# Patient Record
Sex: Female | Born: 1950 | Race: Black or African American | Hispanic: No | Smoking: Never smoker
Health system: Southern US, Community
[De-identification: ages and names within clinical notes are randomized; demographics above are authoritative.]

## PROBLEM LIST (undated history)

## (undated) DIAGNOSIS — C801 Malignant (primary) neoplasm, unspecified: Secondary | ICD-10-CM

## (undated) DIAGNOSIS — E119 Type 2 diabetes mellitus without complications: Secondary | ICD-10-CM

## (undated) DIAGNOSIS — I10 Essential (primary) hypertension: Secondary | ICD-10-CM

## (undated) HISTORY — PX: HIP SURGERY: SHX245

## (undated) HISTORY — PX: BREAST SURGERY: SHX581

## (undated) HISTORY — PX: BRAIN SURGERY: SHX531

---

## 2002-05-09 DIAGNOSIS — C801 Malignant (primary) neoplasm, unspecified: Secondary | ICD-10-CM

## 2002-05-09 HISTORY — DX: Malignant (primary) neoplasm, unspecified: C80.1

## 2017-03-29 ENCOUNTER — Observation Stay (HOSPITAL_COMMUNITY)
Admission: EM | Admit: 2017-03-29 | Discharge: 2017-03-31 | Disposition: A | Discharge status: Home / Self Care | Payer: Medicare Other | Attending: Internal Medicine | Admitting: Internal Medicine

## 2017-03-29 ENCOUNTER — Emergency Department (HOSPITAL_COMMUNITY): Payer: Medicare Other

## 2017-03-29 ENCOUNTER — Encounter (HOSPITAL_COMMUNITY): Payer: Self-pay

## 2017-03-29 DIAGNOSIS — E118 Type 2 diabetes mellitus with unspecified complications: Secondary | ICD-10-CM

## 2017-03-29 DIAGNOSIS — Z79899 Other long term (current) drug therapy: Secondary | ICD-10-CM | POA: Insufficient documentation

## 2017-03-29 DIAGNOSIS — C50919 Malignant neoplasm of unspecified site of unspecified female breast: Secondary | ICD-10-CM

## 2017-03-29 DIAGNOSIS — R4020 Unspecified coma: Secondary | ICD-10-CM | POA: Insufficient documentation

## 2017-03-29 DIAGNOSIS — I10 Essential (primary) hypertension: Secondary | ICD-10-CM | POA: Diagnosis not present

## 2017-03-29 DIAGNOSIS — N390 Urinary tract infection, site not specified: Secondary | ICD-10-CM | POA: Diagnosis not present

## 2017-03-29 DIAGNOSIS — Z853 Personal history of malignant neoplasm of breast: Secondary | ICD-10-CM | POA: Diagnosis not present

## 2017-03-29 DIAGNOSIS — R55 Syncope and collapse: Principal | ICD-10-CM | POA: Insufficient documentation

## 2017-03-29 DIAGNOSIS — Z7984 Long term (current) use of oral hypoglycemic drugs: Secondary | ICD-10-CM | POA: Insufficient documentation

## 2017-03-29 DIAGNOSIS — D649 Anemia, unspecified: Secondary | ICD-10-CM | POA: Diagnosis present

## 2017-03-29 DIAGNOSIS — Z7982 Long term (current) use of aspirin: Secondary | ICD-10-CM | POA: Diagnosis not present

## 2017-03-29 DIAGNOSIS — I160 Hypertensive urgency: Secondary | ICD-10-CM | POA: Diagnosis present

## 2017-03-29 DIAGNOSIS — E119 Type 2 diabetes mellitus without complications: Secondary | ICD-10-CM | POA: Diagnosis not present

## 2017-03-29 DIAGNOSIS — Z85841 Personal history of malignant neoplasm of brain: Secondary | ICD-10-CM | POA: Insufficient documentation

## 2017-03-29 DIAGNOSIS — R2681 Unsteadiness on feet: Secondary | ICD-10-CM | POA: Diagnosis not present

## 2017-03-29 DIAGNOSIS — N289 Disorder of kidney and ureter, unspecified: Secondary | ICD-10-CM

## 2017-03-29 DIAGNOSIS — Z7902 Long term (current) use of antithrombotics/antiplatelets: Secondary | ICD-10-CM | POA: Insufficient documentation

## 2017-03-29 DIAGNOSIS — Z87898 Personal history of other specified conditions: Secondary | ICD-10-CM | POA: Diagnosis not present

## 2017-03-29 HISTORY — DX: Malignant (primary) neoplasm, unspecified: C80.1

## 2017-03-29 HISTORY — DX: Essential (primary) hypertension: I10

## 2017-03-29 HISTORY — DX: Type 2 diabetes mellitus without complications: E11.9

## 2017-03-29 LAB — COMPREHENSIVE METABOLIC PANEL
ALBUMIN: 3.8 g/dL (ref 3.5–5.0)
ALK PHOS: 61 U/L (ref 38–126)
ALT: 11 U/L — AB (ref 14–54)
ANION GAP: 9 (ref 5–15)
AST: 15 U/L (ref 15–41)
BUN: 16 mg/dL (ref 6–20)
CALCIUM: 9.4 mg/dL (ref 8.9–10.3)
CO2: 25 mmol/L (ref 22–32)
Chloride: 103 mmol/L (ref 101–111)
Creatinine, Ser: 1.18 mg/dL — ABNORMAL HIGH (ref 0.44–1.00)
GFR calc Af Amer: 55 mL/min — ABNORMAL LOW (ref >60)
GFR calc non Af Amer: 48 mL/min — ABNORMAL LOW (ref >60)
GLUCOSE: 116 mg/dL — AB (ref 65–99)
Potassium: 3.3 mmol/L — ABNORMAL LOW (ref 3.5–5.1)
Sodium: 137 mmol/L (ref 135–145)
Total Bilirubin: 0.2 mg/dL — ABNORMAL LOW (ref 0.3–1.2)
Total Protein: 7.4 g/dL (ref 6.5–8.1)

## 2017-03-29 LAB — URINALYSIS, ROUTINE W REFLEX MICROSCOPIC
BILIRUBIN URINE: NEGATIVE
GLUCOSE, UA: NEGATIVE mg/dL
KETONES UR: NEGATIVE mg/dL
NITRITE: NEGATIVE
PH: 7 (ref 5.0–8.0)
Protein, ur: NEGATIVE mg/dL
SPECIFIC GRAVITY, URINE: 1.006 (ref 1.005–1.030)
Squamous Epithelial / LPF: NONE SEEN

## 2017-03-29 LAB — CBC WITH DIFFERENTIAL/PLATELET
BASOS ABS: 0 10*3/uL (ref 0.0–0.1)
Basophils Relative: 0 %
EOS PCT: 1 %
Eosinophils Absolute: 0.1 10*3/uL (ref 0.0–0.7)
HCT: 37 % (ref 36.0–46.0)
Hemoglobin: 11.9 g/dL — ABNORMAL LOW (ref 12.0–15.0)
Lymphocytes Relative: 38 %
Lymphs Abs: 2.7 10*3/uL (ref 0.7–4.0)
MCH: 28.4 pg (ref 26.0–34.0)
MCHC: 32.2 g/dL (ref 30.0–36.0)
MCV: 88.3 fL (ref 78.0–100.0)
MONO ABS: 0.3 10*3/uL (ref 0.1–1.0)
MONOS PCT: 4 %
NEUTROS PCT: 57 %
Neutro Abs: 3.9 10*3/uL (ref 1.7–7.7)
PLATELETS: 169 10*3/uL (ref 150–400)
RBC: 4.19 MIL/uL (ref 3.87–5.11)
RDW: 14.5 % (ref 11.5–15.5)
WBC: 7 10*3/uL (ref 4.0–10.5)

## 2017-03-29 LAB — I-STAT CHEM 8, ED
BUN: 19 mg/dL (ref 6–20)
CHLORIDE: 104 mmol/L (ref 101–111)
Calcium, Ion: 1.19 mmol/L (ref 1.15–1.40)
Creatinine, Ser: 1.2 mg/dL — ABNORMAL HIGH (ref 0.44–1.00)
GLUCOSE: 118 mg/dL — AB (ref 65–99)
HEMATOCRIT: 34 % — AB (ref 36.0–46.0)
HEMOGLOBIN: 11.6 g/dL — AB (ref 12.0–15.0)
POTASSIUM: 3.7 mmol/L (ref 3.5–5.1)
Sodium: 140 mmol/L (ref 135–145)
TCO2: 29 mmol/L (ref 22–32)

## 2017-03-29 LAB — CBG MONITORING, ED: GLUCOSE-CAPILLARY: 110 mg/dL — AB (ref 65–99)

## 2017-03-29 MED ORDER — LABETALOL HCL 5 MG/ML IV SOLN
10.0000 mg | Freq: Once | INTRAVENOUS | Status: AC
  Administered 2017-03-29: 10 mg via INTRAVENOUS
  Filled 2017-03-29: qty 4

## 2017-03-29 MED ORDER — SODIUM CHLORIDE 0.9 % IV BOLUS (SEPSIS)
1000.0000 mL | Freq: Once | INTRAVENOUS | Status: AC
  Administered 2017-03-29: 1000 mL via INTRAVENOUS

## 2017-03-29 MED ORDER — LISINOPRIL 10 MG PO TABS
10.0000 mg | ORAL_TABLET | Freq: Every day | ORAL | Status: DC
  Administered 2017-03-30 – 2017-03-31 (×2): 10 mg via ORAL
  Filled 2017-03-29 (×2): qty 1

## 2017-03-29 NOTE — ED Triage Notes (Addendum)
Pt from out of town ([place], [place]) presents via EMS after syncopal episode that occurred at approx 1925. Per EMS, pt was standing at her stove cooking when she lost consciousness. Unsure if pt sustained head injury. Per family, pt was unresponsive for approx 5 mins. Pt also reported nausea and generalized weakness. Pt alert to self but disoriented to time, place, and situation. EMS VS: 170/76, 76 HR, RR 16, 99% on RA, CBG 120.

## 2017-03-29 NOTE — ED Notes (Signed)
Per pt's daughter, pt was cooking over the stove when she began to lean to the left side and complaining of dizziness. Pt family reports she noticed her tongue fall to the left side and pt "turned white". Pt was assisted into chair by family. Pt daughter denies pt falling on the floor or any head injury. Pt's daughter states pt never lost consciousness but she "wasn't responding to my questions." No facial palsy noted. Grips present and strong bilaterally. PERRL.

## 2017-03-29 NOTE — ED Notes (Signed)
Patient transported to MRI 

## 2017-03-29 NOTE — ED Notes (Signed)
EDP updated to pt BP. Pharmacy at bedside updating medications.

## 2017-03-29 NOTE — ED Notes (Addendum)
Pt transported to CT and XR. EDP aware of pt BP

## 2017-03-29 NOTE — ED Notes (Signed)
Dr. Tyrone Nine at bedside, daughter just arrived.

## 2017-03-29 NOTE — ED Notes (Signed)
EDP at the bedside.  ?

## 2017-03-29 NOTE — ED Provider Notes (Signed)
[facility] EMERGENCY DEPARTMENT Provider Note   CSN: [id number] Arrival date & time: 03/29/17  2039     History   Chief Complaint Chief Complaint  Patient presents with  . Loss of Consciousness    HPI Kelly Leon is a 66 y.o. female.  66 yo F with a chief complaint of a syncopal event.  The patient was standing at a table and suddenly collapsed to the ground.  Per the family she mentioned that she felt bad and that she felt like she may get sick just before she collapsed.  They estimated that she was unresponsive for about 5 minutes.  Regained consciousness about EMS arrival.  She is confused on arousal.  Does not remember the scenario.  Does not know where she is.  Is able to state her name and her address.  She denies any symptoms currently.   The history is provided by the patient.  Loss of Consciousness   This is a new problem. The current episode started less than 1 hour ago. The problem occurs rarely. The problem has been resolved. She lost consciousness for a period of 1 to 5 minutes. The problem is associated with normal activity. Pertinent negatives include chest pain, congestion, dizziness, fever, headaches, nausea, palpitations and vomiting. She has tried nothing for the symptoms. The treatment provided no relief.    Past Medical History:  Diagnosis Date  . 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 Cancer [facility]) 2004   Breast   . Diabetes mellitus without complication ([place])   . Hypertension     There are no active problems to display for this patient.   Past Surgical History:  Procedure Laterality Date  . BRAIN SURGERY    . BREAST SURGERY    . HIP SURGERY      OB History    No data available       Home Medications    Prior to Admission medications   Medication Sig Start Date End Date Taking? Authorizing Provider  aspirin EC 81 MG tablet Take 81 mg by mouth every morning.   Yes [provider]  citalopram (CELEXA) 40 MG tablet  Take 40 mg by mouth daily.   Yes [provider]  exemestane (AROMASIN) 25 MG tablet Take 25 mg by mouth daily after breakfast.   Yes [provider]  ibuprofen (ADVIL,MOTRIN) 200 MG tablet Take 200 mg by mouth every 6 (six) hours as needed for moderate pain.   Yes [provider]  lisinopril (PRINIVIL,ZESTRIL) 10 MG tablet Take 10 mg by mouth daily.   Yes [provider]  pravastatin (PRAVACHOL) 40 MG tablet Take 40 mg by mouth daily.   Yes [provider]  sitaGLIPtin (JANUVIA) 50 MG tablet Take 50 mg by mouth daily.   Yes [provider]  solifenacin (VESICARE) 5 MG tablet Take 5 mg by mouth every evening.   Yes [provider]    Family History No family history on file.  Social History Social History   Tobacco Use  . Smoking status: Never Smoker  . Smokeless tobacco: Never Used  Substance Use Topics  . Alcohol use: No    Frequency: Never  . Drug use: No     Allergies   Patient has no known allergies.   Review of Systems Review of Systems  Constitutional: Negative for chills and fever.  HENT: Negative for congestion and rhinorrhea.   Eyes: Negative for redness and visual disturbance.  Respiratory: Negative  for shortness of breath and wheezing.   Cardiovascular: Positive for syncope. Negative for chest pain and palpitations.  Gastrointestinal: Negative for nausea and vomiting.  Genitourinary: Negative for dysuria and urgency.  Musculoskeletal: Negative for arthralgias and myalgias.  Skin: Negative for pallor and wound.  Neurological: Positive for syncope. Negative for dizziness and headaches.     Physical Exam Updated Vital Signs BP (!) 194/87   Pulse 72   Temp 97.8 F (36.6 C) (Oral)   Resp 15   Ht 5\' 4"  (1.626 m)   Wt 72.6 kg (160 lb)   SpO2 100%   BMI 27.46 kg/m   Physical Exam  Constitutional: She is oriented to person, place, and time. She appears well-developed and well-nourished. No  distress.  HENT:  Head: Normocephalic and atraumatic.  Eyes: EOM are normal. Pupils are equal, round, and reactive to light.  Neck: Normal range of motion. Neck supple.  Cardiovascular: Normal rate and regular rhythm. Exam reveals no gallop and no friction rub.  No murmur heard. Pulmonary/Chest: Effort normal. She has no wheezes. She has no rales.  Abdominal: Soft. She exhibits no distension and no mass. There is no tenderness. There is no rebound and no guarding.  Musculoskeletal: She exhibits no edema or tenderness.  Neurological: She is alert and oriented to person, place, and time. No cranial nerve deficit. GCS eye subscore is 4. GCS verbal subscore is 4. GCS motor subscore is 6. She displays no Babinski's sign on the right side. She displays no Babinski's sign on the left side.  Left lower extremity 4 out of 5 compared to right 5 out of 5.  This is chronic for the family due to a left hip replacement  Skin: Skin is warm and dry. She is not diaphoretic.  Psychiatric: She has a normal mood and affect. Her behavior is normal.  Nursing note and vitals reviewed.    ED Treatments / Results  Labs (all labs ordered are listed, but only abnormal results are displayed) Labs Reviewed  CBC WITH DIFFERENTIAL/PLATELET - Abnormal; Notable for the following components:      Result Value   Hemoglobin 11.9 (*)    All other components within normal limits  URINALYSIS, ROUTINE W REFLEX MICROSCOPIC - Abnormal; Notable for the following components:   Color, Urine STRAW (*)    Hgb urine dipstick SMALL (*)    Leukocytes, UA MODERATE (*)    Bacteria, UA RARE (*)    All other components within normal limits  COMPREHENSIVE METABOLIC PANEL - Abnormal; Notable for the following components:   Potassium 3.3 (*)    Glucose, Bld 116 (*)    Creatinine, Ser 1.18 (*)    ALT 11 (*)    Total Bilirubin 0.2 (*)    GFR calc non Af Amer 48 (*)    GFR calc Af Amer 55 (*)    All other components within normal  limits  CBG MONITORING, ED - Abnormal; Notable for the following components:   Glucose-Capillary 110 (*)    All other components within normal limits  I-STAT CHEM 8, ED - Abnormal; Notable for the following components:   Creatinine, Ser 1.20 (*)    Glucose, Bld 118 (*)    Hemoglobin 11.6 (*)    HCT 34.0 (*)    All other components within normal limits    EKG  EKG Interpretation  Date/Time:  Wednesday March 29 2017 20:57:48 EST Ventricular Rate:  75 PR Interval:    QRS Duration: 81 QT Interval:  401 QTC Calculation: 448 R Axis:   32 Text Interpretation:  Sinus rhythm Abnormal R-wave progression, early transition Nonspecific T abnormalities, lateral leads No old tracing to compare Confirmed by Deno Etienne [phone]) on 03/29/2017 9:21:16 PM       Radiology Dg Chest 2 View  Result Date: 03/29/2017 CLINICAL DATA:  Syncope today.  Feeling weak. EXAM: CHEST  2 VIEW COMPARISON:  None. FINDINGS: Shallow inspiration. Normal heart size and pulmonary vascularity. No focal airspace disease or consolidation in the lungs. No blunting of costophrenic angles. No pneumothorax. Mediastinal contours appear intact. Surgical clips in the right axilla. IMPRESSION: Shallow inspiration.  No active cardiopulmonary disease. Electronically Signed   By: Lucienne Capers M.D.   On: 03/29/2017 21:45   Ct Head Wo Contrast  Result Date: 03/29/2017 CLINICAL DATA:  Syncopal episode today. Unresponsive for 5 minutes. History of diabetes and hypertension. EXAM: CT HEAD WITHOUT CONTRAST TECHNIQUE: Contiguous axial images were obtained from the base of the skull through the vertex without intravenous contrast. COMPARISON:  None. FINDINGS: Brain: Focal area of encephalomalacia in the left cerebellum, likely postoperative. Mild cerebral atrophy. Mild ventricular dilatation consistent with central atrophy. Patchy low-attenuation changes in the deep white matter likely representing small vessel ischemia. There is  asymmetric focal low-attenuation demonstrated in the subcortical region in the right corona radiata. This may represent an acute infarct. Consider MRI for further evaluation if clinically indicated. No mass effect or midline shift. No abnormal extra-axial fluid collections. Gray-white matter junctions are distinct. Basal cisterns are not effaced. No acute intracranial hemorrhage. Vascular: No hyperdense vessel or unexpected calcification. Skull: Postoperative changes with craniectomy in the left inferior occipital region. Plate and screw fixation of the bone flap. No acute depressed skull fractures. Sinuses/Orbits: Paranasal sinuses and mastoid air cells are clear. Other: None. IMPRESSION: 1. Asymmetric focal low-attenuation demonstrated in the subcortical right corona radiata may represent acute infarct in the appropriate clinical setting. Consider MRI for further evaluation clinically indicated. No acute intracranial hemorrhage. 2. Chronic atrophy and small vessel ischemic changes. 3. Postoperative changes in the posterior fossa on the left with focal encephalomalacia in the left cerebellum. Electronically Signed   By: Lucienne Capers M.D.   On: 03/29/2017 22:02    Procedures Procedures (including critical care time)  Medications Ordered in ED Medications  sodium chloride 0.9 % bolus 1,000 mL (1,000 mLs Intravenous New Bag/Given 03/29/17 2118)  labetalol (NORMODYNE,TRANDATE) injection 10 mg (10 mg Intravenous Given 03/29/17 2255)     Initial Impression / Assessment and Plan / ED Course  I have reviewed the triage vital signs and the nursing notes.  Pertinent labs & imaging results that were available during my care of the patient were reviewed by me and considered in my medical decision making (see chart for details).     66 yo F with a chief complaint of syncope.  This is complicated by the fact the patient is acutely altered afterwards.  She is also noted to be hypertensive.    CT the head  is negative for acute intracranial hemorrhage.  The radiologist is concerned about a finding that could represent a stroke.  On reassessment the patient is feeling better.  Is closer to her baseline per the family.  I am concerned about the the patient's syncopal event being atypical from the norm with a prolonged confused duration.  I would discuss with the hospitalist about possible observation.  The patient was also noted to be hypertensive into the 190s.  She claims that  she took her medication this morning.  We will give a dose of labetalol.  The patients results and plan were reviewed and discussed.   Any x-rays performed were independently reviewed by myself.   Differential diagnosis were considered with the presenting HPI.  Medications  sodium chloride 0.9 % bolus 1,000 mL (1,000 mLs Intravenous New Bag/Given 03/29/17 2118)  labetalol (NORMODYNE,TRANDATE) injection 10 mg (10 mg Intravenous Given 03/29/17 2255)    Vitals:   03/29/17 2145 03/29/17 2208 03/29/17 2215 03/29/17 2230  BP: (!) 214/104 (!) 191/69 (!) 177/76 (!) 194/87  Pulse: 72 72 63 72  Resp: 19 20 (!) 22 15  Temp:      TempSrc:      SpO2: 100% 100% 100% 100%  Weight:      Height:        Final diagnoses:  Syncope and collapse  Essential hypertension    Admission/ observation were discussed with the admitting physician, patient and/or family and they are comfortable with the plan.   Final Clinical Impressions(s) / ED Diagnoses   Final diagnoses:  Syncope and collapse  Essential hypertension    ED Discharge Orders    None       Deno Etienne, DO 03/29/17 2327

## 2017-03-30 ENCOUNTER — Other Ambulatory Visit: Payer: Self-pay

## 2017-03-30 ENCOUNTER — Encounter (HOSPITAL_COMMUNITY): Payer: Self-pay | Admitting: Internal Medicine

## 2017-03-30 DIAGNOSIS — D649 Anemia, unspecified: Secondary | ICD-10-CM | POA: Diagnosis present

## 2017-03-30 DIAGNOSIS — R55 Syncope and collapse: Secondary | ICD-10-CM | POA: Diagnosis not present

## 2017-03-30 DIAGNOSIS — N390 Urinary tract infection, site not specified: Secondary | ICD-10-CM | POA: Diagnosis present

## 2017-03-30 DIAGNOSIS — I10 Essential (primary) hypertension: Secondary | ICD-10-CM

## 2017-03-30 DIAGNOSIS — I160 Hypertensive urgency: Secondary | ICD-10-CM | POA: Diagnosis not present

## 2017-03-30 DIAGNOSIS — N289 Disorder of kidney and ureter, unspecified: Secondary | ICD-10-CM

## 2017-03-30 DIAGNOSIS — E119 Type 2 diabetes mellitus without complications: Secondary | ICD-10-CM

## 2017-03-30 DIAGNOSIS — E118 Type 2 diabetes mellitus with unspecified complications: Secondary | ICD-10-CM | POA: Diagnosis not present

## 2017-03-30 LAB — GLUCOSE, CAPILLARY
GLUCOSE-CAPILLARY: 110 mg/dL — AB (ref 65–99)
GLUCOSE-CAPILLARY: 131 mg/dL — AB (ref 65–99)
GLUCOSE-CAPILLARY: 92 mg/dL (ref 65–99)
Glucose-Capillary: 140 mg/dL — ABNORMAL HIGH (ref 65–99)

## 2017-03-30 LAB — BASIC METABOLIC PANEL
ANION GAP: 10 (ref 5–15)
BUN: 12 mg/dL (ref 6–20)
CO2: 23 mmol/L (ref 22–32)
Calcium: 8.7 mg/dL — ABNORMAL LOW (ref 8.9–10.3)
Chloride: 106 mmol/L (ref 101–111)
Creatinine, Ser: 0.96 mg/dL (ref 0.44–1.00)
GFR calc Af Amer: >60 mL/min (ref >60)
GFR calc non Af Amer: >60 mL/min (ref >60)
GLUCOSE: 126 mg/dL — AB (ref 65–99)
POTASSIUM: 3.4 mmol/L — AB (ref 3.5–5.1)
Sodium: 139 mmol/L (ref 135–145)

## 2017-03-30 LAB — TROPONIN I
Troponin I: <0.03 ng/mL (ref <0.03)
Troponin I: <0.03 ng/mL (ref <0.03)

## 2017-03-30 LAB — TSH: TSH: 2.092 u[IU]/mL (ref 0.350–4.500)

## 2017-03-30 LAB — CBC
HEMATOCRIT: 34.2 % — AB (ref 36.0–46.0)
Hemoglobin: 10.8 g/dL — ABNORMAL LOW (ref 12.0–15.0)
MCH: 27.8 pg (ref 26.0–34.0)
MCHC: 31.6 g/dL (ref 30.0–36.0)
MCV: 88.1 fL (ref 78.0–100.0)
Platelets: 283 10*3/uL (ref 150–400)
RBC: 3.88 MIL/uL (ref 3.87–5.11)
RDW: 14.6 % (ref 11.5–15.5)
WBC: 8.5 10*3/uL (ref 4.0–10.5)

## 2017-03-30 MED ORDER — EXEMESTANE 25 MG PO TABS
25.0000 mg | ORAL_TABLET | Freq: Every day | ORAL | Status: DC
  Administered 2017-03-30 – 2017-03-31 (×2): 25 mg via ORAL
  Filled 2017-03-30 (×2): qty 1

## 2017-03-30 MED ORDER — DEXTROSE 5 % IV SOLN: 1.0000 g | Freq: Once | INTRAVENOUS | Status: DC

## 2017-03-30 MED ORDER — CITALOPRAM HYDROBROMIDE 40 MG PO TABS
40.0000 mg | ORAL_TABLET | Freq: Every day | ORAL | Status: DC
Start: 2017-03-30 — End: 2017-03-31
  Administered 2017-03-30 – 2017-03-31 (×2): 40 mg via ORAL
  Filled 2017-03-30 (×2): qty 1

## 2017-03-30 MED ORDER — POTASSIUM CHLORIDE CRYS ER 20 MEQ PO TBCR
40.0000 meq | EXTENDED_RELEASE_TABLET | Freq: Once | ORAL | Status: AC
  Administered 2017-03-30: 40 meq via ORAL
  Filled 2017-03-30: qty 2

## 2017-03-30 MED ORDER — INSULIN ASPART 100 UNIT/ML ~~LOC~~ SOLN
0.0000 [IU] | Freq: Three times a day (TID) | SUBCUTANEOUS | Status: DC
  Administered 2017-03-30 (×2): 1 [IU] via SUBCUTANEOUS

## 2017-03-30 MED ORDER — SODIUM CHLORIDE 0.9 % IV SOLN
INTRAVENOUS | Status: DC
  Administered 2017-03-30 – 2017-03-31 (×2): via INTRAVENOUS

## 2017-03-30 MED ORDER — ENOXAPARIN SODIUM 40 MG/0.4ML ~~LOC~~ SOLN
40.0000 mg | SUBCUTANEOUS | Status: DC
  Administered 2017-03-30 – 2017-03-31 (×2): 40 mg via SUBCUTANEOUS
  Filled 2017-03-30 (×2): qty 0.4

## 2017-03-30 MED ORDER — DARIFENACIN HYDROBROMIDE ER 7.5 MG PO TB24
7.5000 mg | ORAL_TABLET | Freq: Every day | ORAL | Status: DC
  Administered 2017-03-30 – 2017-03-31 (×2): 7.5 mg via ORAL
  Filled 2017-03-30 (×2): qty 1

## 2017-03-30 MED ORDER — DEXTROSE 5 % IV SOLN
1.0000 g | INTRAVENOUS | Status: DC
  Administered 2017-03-30 – 2017-03-31 (×2): 1 g via INTRAVENOUS
  Filled 2017-03-30 (×2): qty 10

## 2017-03-30 MED ORDER — HYDRALAZINE HCL 20 MG/ML IJ SOLN: 10.0000 mg | INTRAMUSCULAR | Status: DC | PRN

## 2017-03-30 MED ORDER — POTASSIUM CHLORIDE CRYS ER 20 MEQ PO TBCR
40.0000 meq | EXTENDED_RELEASE_TABLET | ORAL | Status: AC
  Administered 2017-03-30: 40 meq via ORAL
  Filled 2017-03-30: qty 2

## 2017-03-30 MED ORDER — ASPIRIN EC 81 MG PO TBEC
81.0000 mg | DELAYED_RELEASE_TABLET | Freq: Every morning | ORAL | Status: DC
  Administered 2017-03-30 – 2017-03-31 (×2): 81 mg via ORAL
  Filled 2017-03-30 (×2): qty 1

## 2017-03-30 MED ORDER — PRAVASTATIN SODIUM 40 MG PO TABS
40.0000 mg | ORAL_TABLET | Freq: Every day | ORAL | Status: DC
  Administered 2017-03-30 – 2017-03-31 (×2): 40 mg via ORAL
  Filled 2017-03-30 (×2): qty 1

## 2017-03-30 NOTE — Progress Notes (Signed)
Patient arrived to unit via bed. Patient awake, alert and oriented x3. Patient denies pain , numbness and tingling at this time. Plan of care reviewed with patient per MD orders set. Patient verbalizes understanding.

## 2017-03-30 NOTE — ED Notes (Signed)
Attempted report 

## 2017-03-30 NOTE — Progress Notes (Signed)
Pharmacy Antibiotic Note  Kelly Leon is a 66 y.o. female admitted on 03/29/2017 with syncope.  Pharmacy has been consulted for ceftriaxone dosing for UTI.  Plan: Ceftriaxone 1 g IV q24h No adjustment needed for renal function Pharmacy signing off, please re-consult if needed  Height: 5\' 4"  (162.6 cm) Weight: 160 lb (72.6 kg) IBW/kg (Calculated) : 54.7  Temp (24hrs), Avg:97.8 F (36.6 C), Min:97.8 F (36.6 C), Max:97.8 F (36.6 C)  Recent Labs  Lab 03/29/17 2056 03/29/17 2122  WBC 7.0  --   CREATININE 1.18* 1.20*    Estimated Creatinine Clearance: 46.3 mL/min (A) (by C-G formula based on SCr of 1.2 mg/dL (H)).    No Known Allergies   Thank you for allowing pharmacy to be a part of this patient's care.  Renold Genta, PharmD, [place] Pharmacist Main pharmacy - [phone] 03/30/2017 12:35 AM

## 2017-03-30 NOTE — ED Notes (Signed)
Pt daughter Hajer Dwyer [phone]

## 2017-03-30 NOTE — ED Notes (Signed)
Pt placed on hospital bed for comfort.
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
## 2017-03-31 ENCOUNTER — Observation Stay (HOSPITAL_BASED_OUTPATIENT_CLINIC_OR_DEPARTMENT_OTHER): Payer: Medicare Other

## 2017-03-31 DIAGNOSIS — I340 Nonrheumatic mitral (valve) insufficiency: Secondary | ICD-10-CM

## 2017-03-31 DIAGNOSIS — I160 Hypertensive urgency: Secondary | ICD-10-CM | POA: Diagnosis not present

## 2017-03-31 DIAGNOSIS — E118 Type 2 diabetes mellitus with unspecified complications: Secondary | ICD-10-CM | POA: Diagnosis not present

## 2017-03-31 DIAGNOSIS — I10 Essential (primary) hypertension: Secondary | ICD-10-CM | POA: Diagnosis not present

## 2017-03-31 DIAGNOSIS — D649 Anemia, unspecified: Secondary | ICD-10-CM | POA: Diagnosis not present

## 2017-03-31 DIAGNOSIS — R55 Syncope and collapse: Secondary | ICD-10-CM | POA: Diagnosis not present

## 2017-03-31 LAB — BASIC METABOLIC PANEL
Anion gap: 6 (ref 5–15)
BUN: 11 mg/dL (ref 6–20)
CO2: 29 mmol/L (ref 22–32)
Calcium: 9.2 mg/dL (ref 8.9–10.3)
Chloride: 106 mmol/L (ref 101–111)
Creatinine, Ser: 0.88 mg/dL (ref 0.44–1.00)
GFR calc Af Amer: >60 mL/min (ref >60)
GFR calc non Af Amer: >60 mL/min (ref >60)
Glucose, Bld: 131 mg/dL — ABNORMAL HIGH (ref 65–99)
Potassium: 3.5 mmol/L (ref 3.5–5.1)
Sodium: 141 mmol/L (ref 135–145)

## 2017-03-31 LAB — GLUCOSE, CAPILLARY
Glucose-Capillary: 100 mg/dL — ABNORMAL HIGH (ref 65–99)
Glucose-Capillary: 104 mg/dL — ABNORMAL HIGH (ref 65–99)

## 2017-03-31 LAB — CBC
HCT: 34.2 % — ABNORMAL LOW (ref 36.0–46.0)
Hemoglobin: 10.7 g/dL — ABNORMAL LOW (ref 12.0–15.0)
MCH: 27.9 pg (ref 26.0–34.0)
MCHC: 31.3 g/dL (ref 30.0–36.0)
MCV: 89.1 fL (ref 78.0–100.0)
PLATELETS: 282 10*3/uL (ref 150–400)
RBC: 3.84 MIL/uL — ABNORMAL LOW (ref 3.87–5.11)
RDW: 15.2 % (ref 11.5–15.5)
WBC: 6.6 10*3/uL (ref 4.0–10.5)

## 2017-03-31 LAB — URINE CULTURE: Culture: NO GROWTH

## 2017-03-31 LAB — ECHOCARDIOGRAM COMPLETE
Height: 64 in
WEIGHTICAEL: 2560 [oz_av]

## 2017-03-31 MED ORDER — AMLODIPINE BESYLATE 2.5 MG PO TABS: 2.5000 mg | ORAL_TABLET | Freq: Every day | ORAL | 0 refills | Status: DC

## 2017-03-31 MED ORDER — AMLODIPINE BESYLATE 2.5 MG PO TABS
2.5000 mg | ORAL_TABLET | Freq: Every day | ORAL | Status: DC
  Administered 2017-03-31: 2.5 mg via ORAL
  Filled 2017-03-31: qty 1

## 2017-03-31 MED ORDER — AMLODIPINE BESYLATE 2.5 MG PO TABS: 2.5000 mg | ORAL_TABLET | Freq: Every day | ORAL | 0 refills | Status: AC

## 2017-03-31 MED ORDER — AMLODIPINE BESYLATE 5 MG PO TABS: 5.0000 mg | ORAL_TABLET | Freq: Every day | ORAL | Status: DC

## 2017-03-31 NOTE — Progress Notes (Signed)
Discharge instructions, RX's and follow up appts explained and provided to patient and c/g verbalized understanding. Patient left floor via wheelchair accompanied by staff no c/o pain or shortness of breath at d/c.  Gertha Lichtenberg Lynn, RN  
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
## 2017-03-31 NOTE — Care Management Obs Status (Signed)
[place] NOTIFICATION   Patient Details  Name: Vayda Dungee MRN: [id number] Date of Birth: 1950-08-06   Medicare Observation Status Notification Given:  Yes    Pollie Friar, RN 03/31/2017, 1:56 PM
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
## 2017-03-31 NOTE — Care Management Note (Signed)
Case Management Note  Patient Details  Name: Kelly Leon MRN: [id number] Date of Birth: 1950-11-19  Subjective/Objective:                    Action/Plan: Pt discharging home with [facility] services. CM met with the patient and her daughter and provided choice for [facility] agencies. They selected [place]. Brad with [facility] notified and accepted the referral. The daughter is the contact: Elberta Leatherwood [phone]. Patients PCP is in [place], [place] Dr Kenton Kingfisher. Brad made aware.  Daughter to provide transportation home.   Expected Discharge Date:  03/31/17               Expected Discharge Plan:  [place]  In-House Referral:     Discharge planning Services  CM Consult  Post Acute Care Choice:  Home Health Choice offered to:  Patient, Adult Children  DME Arranged:    DME Agency:     HH Arranged:  PT [place]:  [place]  Status of Service:  Completed, signed off  If discussed at [place] of Stay Meetings, dates discussed:    Additional Comments:  Pollie Friar, RN 03/31/2017, 3:43 PM

## 2017-03-31 NOTE — Progress Notes (Signed)
Nutrition Brief Note  Patient identified on the Malnutrition Screening Tool (MST) Report  Wt Readings from Last 15 Encounters:  03/29/17 160 lb (72.6 kg)    Body mass index is 27.46 kg/m. Patient meets criteria for overweight based on current BMI.   Current diet order is heart healthy/carbohydrate modified. Pt reports eating well currently and PTA with usual consumption of 3 meals a day with no other difficulties. Labs and medications reviewed.   No nutrition interventions warranted at this time. If nutrition issues arise, please consult RD.   Corrin Parker, MS, RD, LDN Pager # [phone] After hours/ weekend pager # [phone]

## 2019-06-05 IMAGING — CT CT HEAD W/O CM
4 series · 16 of 47 positions shown, 18 images · non-contrast
Comparison: None.

CLINICAL DATA: Syncopal episode today. Unresponsive for 5 minutes.
History of diabetes and hypertension.

EXAM:
CT HEAD WITHOUT CONTRAST
TECHNIQUE: Contiguous axial images were obtained from the base of the skull
through the vertex without intravenous contrast.

[Series 3: head without · axial · non-contrast · 0.39mm/px · z∈[-96,+24]mm · 7 of 34 slices shown, 9 images]
[im 5/34  brain]
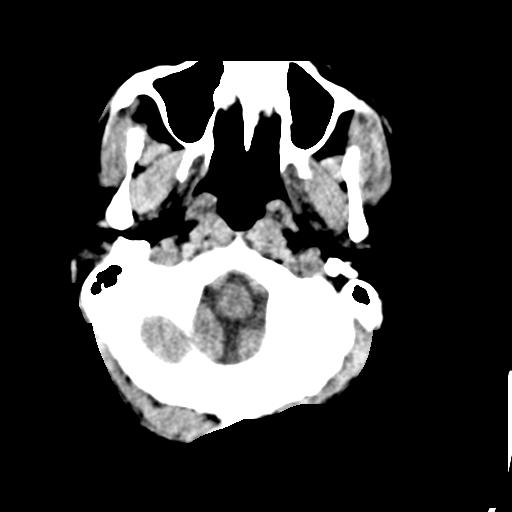
[im 5/34  bone]
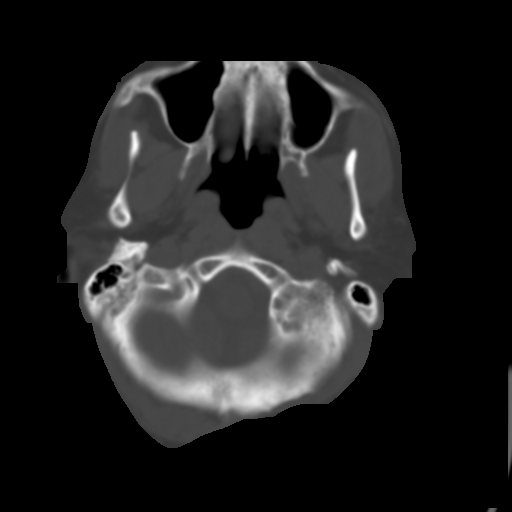
[im 9/34  brain]
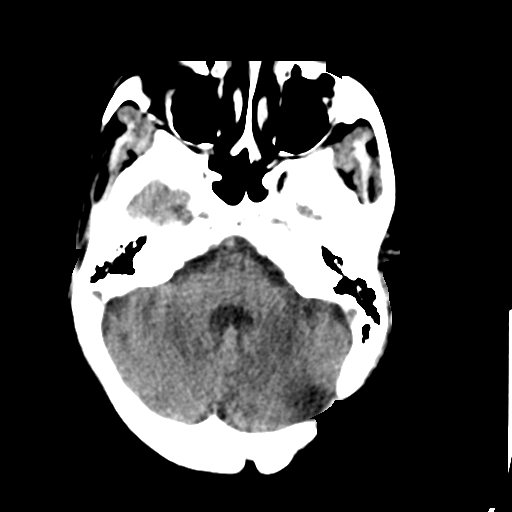
[im 13/34  brain]
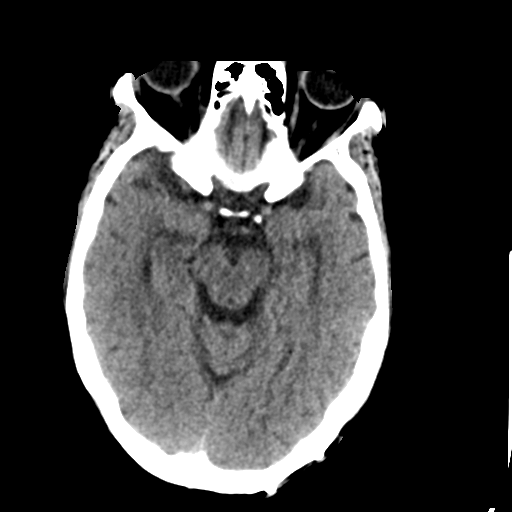
[im 17/34  brain]
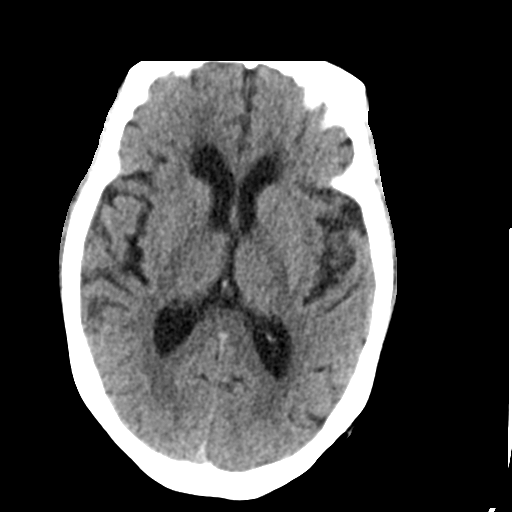
[im 21/34  brain]
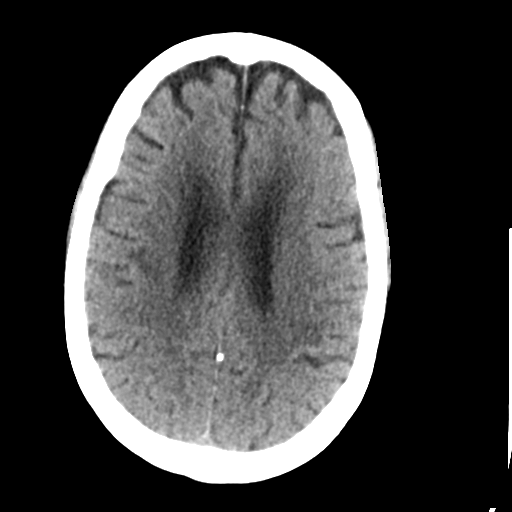
[im 21/34  bone]
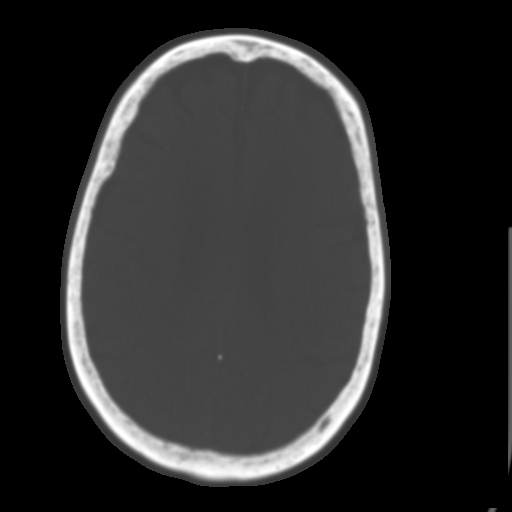
[im 25/34  brain]
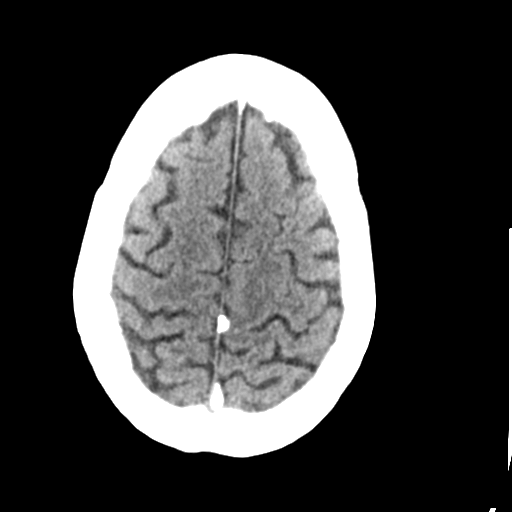
[im 29/34  brain]
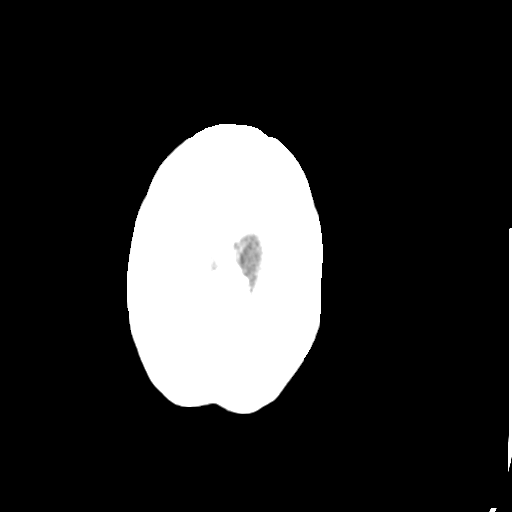

[Series 4: head bone · axial · 0.39mm/px · z∈[-100,-66]mm · 3 of 85 slices shown]
[im 9/85  bone]
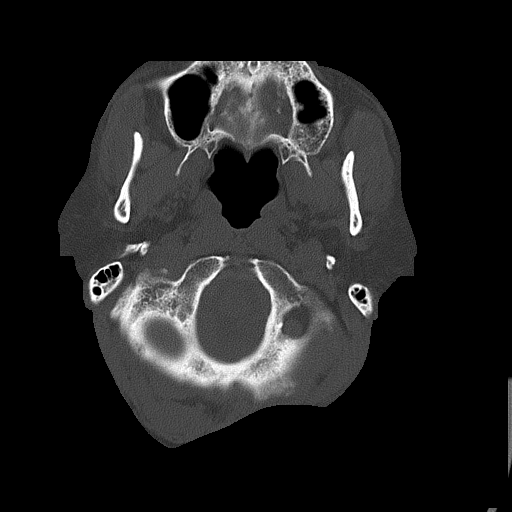
[im 17/85  bone]
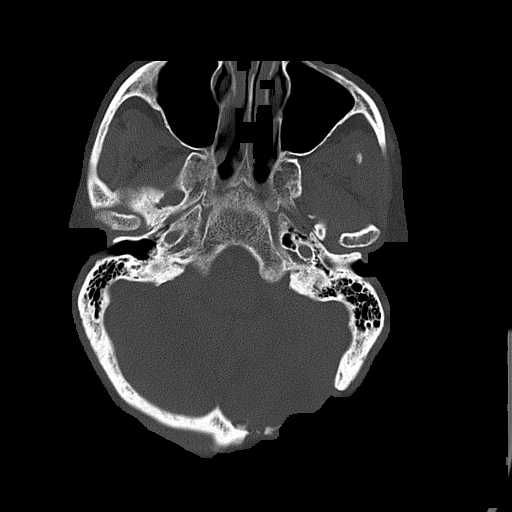
[im 26/85  bone]
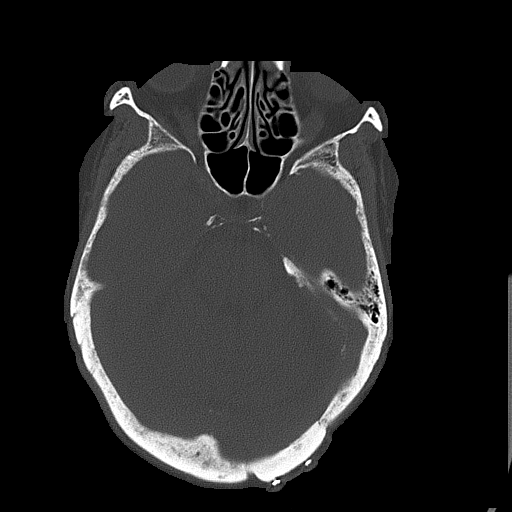

[Series 5: head without cor · coronal · non-contrast · 0.30mm/px · 3 of 67 slices shown]
[im 23/67  brain]
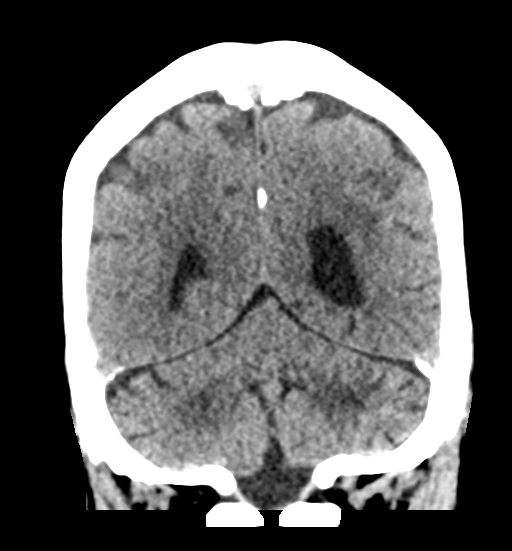
[im 30/67  brain]
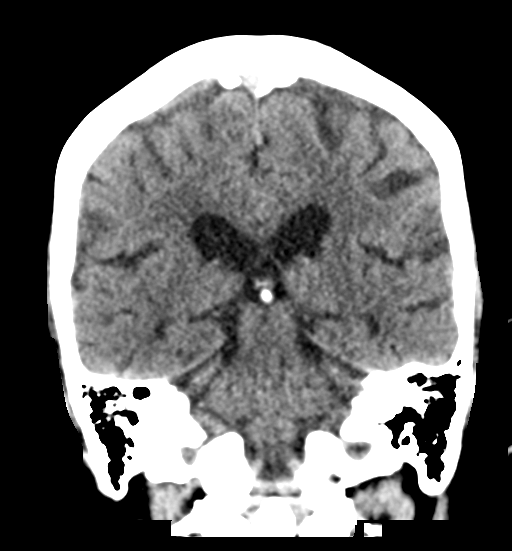
[im 37/67  brain]
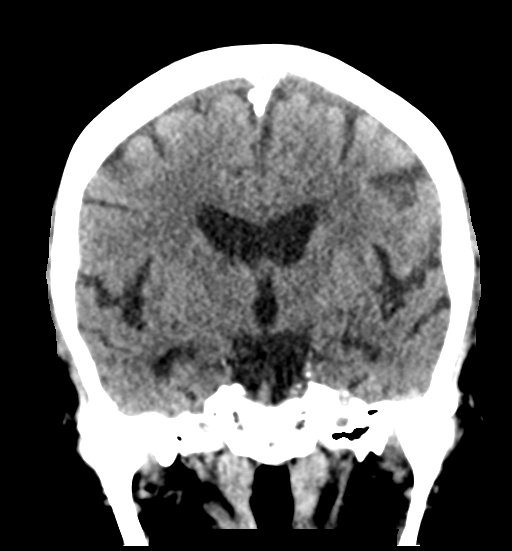

[Series 6: head without sag · sagittal · non-contrast · 0.33mm/px · 3 of 52 slices shown]
[im 18/52  brain]
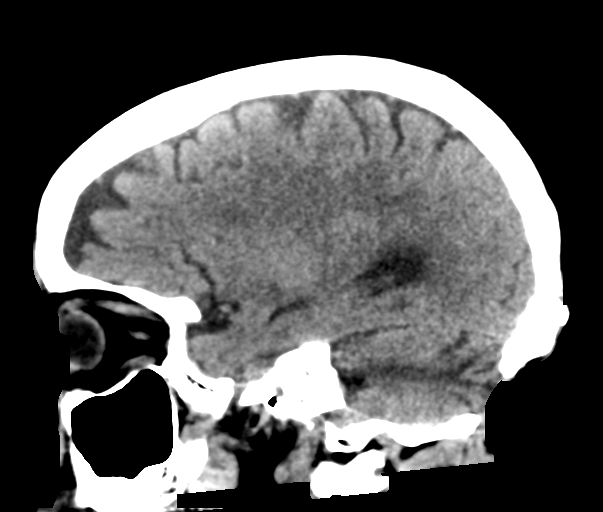
[im 26/52  brain]
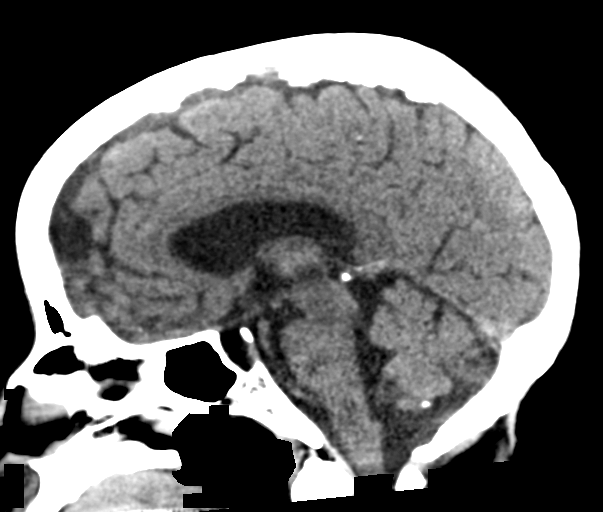
[im 35/52  brain]
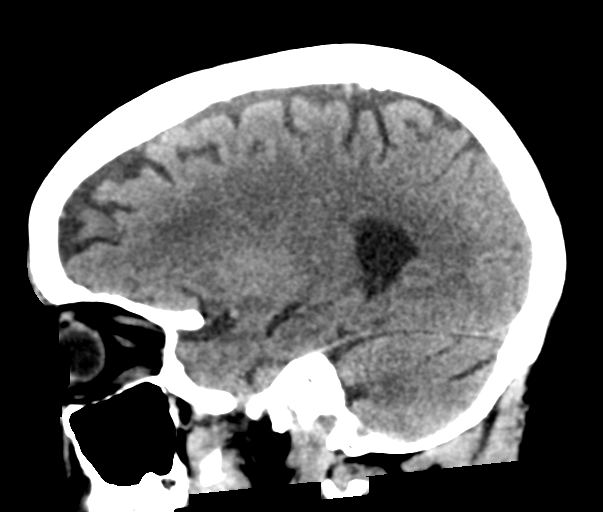

[16 of 47 positions shown; findings below may reference images not displayed]

FINDINGS: Brain: Focal area of encephalomalacia in the left cerebellum, likely
postoperative. Mild cerebral atrophy. Mild ventricular dilatation
consistent with central atrophy. Patchy low-attenuation changes in
the deep white matter likely representing small vessel ischemia.
There is asymmetric focal low-attenuation demonstrated in the
subcortical region in the right corona radiata. This may represent
an acute infarct. Consider MRI for further evaluation if clinically
indicated. No mass effect or midline shift. No abnormal extra-axial
fluid collections. Gray-white matter junctions are distinct. Basal
cisterns are not effaced. No acute intracranial hemorrhage.

Vascular: No hyperdense vessel or unexpected calcification.

Skull: Postoperative changes with craniectomy in the left inferior
occipital region. Plate and screw fixation of the bone flap. No
acute depressed skull fractures.

Sinuses/Orbits: Paranasal sinuses and mastoid air cells are clear.

Other: None.
IMPRESSION: 1. Asymmetric focal low-attenuation demonstrated in the subcortical
right corona radiata may represent acute infarct in the appropriate
clinical setting. Consider MRI for further evaluation clinically
indicated. No acute intracranial hemorrhage.
2. Chronic atrophy and small vessel ischemic changes.
3. Postoperative changes in the posterior fossa on the left with
focal encephalomalacia in the left cerebellum.
